# Patient Record
Sex: Male | Born: 1960 | Race: Black or African American | Hispanic: No | Marital: Married | Smoking: Never smoker
Health system: Southern US, Community
[De-identification: ages and names within clinical notes are randomized; demographics above are authoritative.]

---

## 2006-06-21 ENCOUNTER — Emergency Department (HOSPITAL_COMMUNITY): Admission: EM | Admit: 2006-06-21 | Discharge: 2006-06-21 | Payer: Self-pay | Admitting: Emergency Medicine

## 2016-09-11 ENCOUNTER — Emergency Department (HOSPITAL_COMMUNITY)
Admission: EM | Admit: 2016-09-11 | Discharge: 2016-09-11 | Disposition: A | Discharge status: Home / Self Care | Payer: Managed Care, Other (non HMO) | Attending: Emergency Medicine | Admitting: Emergency Medicine

## 2016-09-11 ENCOUNTER — Emergency Department (HOSPITAL_COMMUNITY): Payer: Managed Care, Other (non HMO)

## 2016-09-11 ENCOUNTER — Encounter (HOSPITAL_COMMUNITY): Payer: Self-pay

## 2016-09-11 DIAGNOSIS — R0981 Nasal congestion: Secondary | ICD-10-CM

## 2016-09-11 DIAGNOSIS — R059 Cough, unspecified: Secondary | ICD-10-CM

## 2016-09-11 DIAGNOSIS — J111 Influenza due to unidentified influenza virus with other respiratory manifestations: Secondary | ICD-10-CM | POA: Insufficient documentation

## 2016-09-11 DIAGNOSIS — R69 Illness, unspecified: Secondary | ICD-10-CM

## 2016-09-11 DIAGNOSIS — R05 Cough: Secondary | ICD-10-CM

## 2016-09-11 DIAGNOSIS — J069 Acute upper respiratory infection, unspecified: Secondary | ICD-10-CM

## 2016-09-11 DIAGNOSIS — Z79899 Other long term (current) drug therapy: Secondary | ICD-10-CM | POA: Diagnosis not present

## 2016-09-11 MED ORDER — FLUTICASONE PROPIONATE 50 MCG/ACT NA SUSP: 2.0000 | Freq: Every day | NASAL | 0 refills | Status: AC

## 2016-09-11 MED ORDER — BENZONATATE 100 MG PO CAPS: 100.0000 mg | ORAL_CAPSULE | Freq: Three times a day (TID) | ORAL | 0 refills | Status: AC

## 2016-09-11 MED ORDER — HYDROCODONE-HOMATROPINE 5-1.5 MG/5ML PO SYRP: 5.0000 mL | ORAL_SOLUTION | Freq: Four times a day (QID) | ORAL | 0 refills | Status: AC | PRN

## 2016-09-11 MED ORDER — GUAIFENESIN 100 MG/5ML PO LIQD: 100.0000 mg | ORAL | 0 refills | Status: AC | PRN

## 2016-09-11 NOTE — ED Provider Notes (Signed)
[facility]-EMERGENCY DEPT Provider Note    By signing my name below, I, Earmon Phoenix, attest that this documentation has been prepared under the direction and in the presence of Sharen Heck, PA-C. Electronically Signed: Earmon Phoenix, ED Scribe. 09/11/16. 1:01 PM    History   Chief Complaint Chief Complaint  Patient presents with  . Cough    The history is provided by the patient and medical records. No language interpreter was used.    Glen Cordova is an obese 56 y.o. male who presents to the Emergency Department complaining of a productive cough of clear mucous that began about three days ago. He reports associated sore throat, congestion and chest soreness secondary to coughing. His wife reports pt was sick with similar symptoms two weeks ago with fevers, myalgias and cough but symptoms resolved without medical treatment.  Patient started symptoms again 3 days ago.  He has taken NyQuil for his symptoms with minimal relief. Pt denies modifying factors. He denies current fever, chills, nausea, vomiting. He denies any chronic illnesses or daily medications.   Pt's wife notes pt never goes to the doctor and never takes medications, she remarks she is surprised he wanted to come to the ED today.    History reviewed. No pertinent past medical history.  There are no active problems to display for this patient.   No past surgical history on file.     Home Medications    Prior to Admission medications   Medication Sig Start Date End Date Taking? Authorizing Provider  benzonatate (TESSALON) 100 MG capsule Take 1 capsule (100 mg total) by mouth every 8 (eight) hours. FOR DISRUPTIVE DAY TIME COUGH, USE AS NEEDED 09/11/16   Liberty Handy, PA-C  fluticasone [facility]) 50 MCG/ACT nasal spray Place 2 sprays into both nostrils daily. 09/11/16   Liberty Handy, PA-C  guaiFENesin (ROBITUSSIN) 100 MG/5ML liquid Take 5-10 mLs (100-200 mg total) by mouth every 4 (four) hours as needed  for cough. 09/11/16   Liberty Handy, PA-C  HYDROcodone-homatropine (HYCODAN) 5-1.5 MG/5ML syrup Take 5 mLs by mouth every 6 (six) hours as needed for cough. FOR NIGHT TIME COUGH AND SORENESS 09/11/16   Liberty Handy, PA-C    Family History History reviewed. No pertinent family history.  Social History Social History  Substance Use Topics  . Smoking status: Not on file  . Smokeless tobacco: Not on file  . Alcohol use Not on file     Allergies   Aspirin   Review of Systems Review of Systems  Constitutional: Negative for chills and fever.  HENT: Positive for congestion. Negative for sore throat.   Eyes: Negative for visual disturbance.  Respiratory: Positive for cough. Negative for chest tightness and shortness of breath.   Cardiovascular: Negative for chest pain.  Gastrointestinal: Negative for abdominal pain, constipation, diarrhea, nausea and vomiting.  Genitourinary: Negative for decreased urine volume and difficulty urinating.  Musculoskeletal: Positive for myalgias. Negative for arthralgias and joint swelling.  Skin: Negative for rash.  Neurological: Negative for dizziness, light-headedness and headaches.     Physical Exam Updated Vital Signs BP 119/83 (BP Location: Left Arm)   Pulse 68   Temp 97.9 F (36.6 C) (Oral)   Resp 16   Ht 6' (1.829 m)   Wt 247 lb (112 kg)   SpO2 95%   BMI 33.50 kg/m   Physical Exam  Constitutional: He is oriented to person, place, and time. He appears well-developed and well-nourished. No distress.  NAD.  HENT:  Head: Normocephalic and atraumatic.  Right Ear: External ear normal.  Left Ear: External ear normal.  Nose: Mucosal edema present. Right sinus exhibits no maxillary sinus tenderness and no frontal sinus tenderness. Left sinus exhibits no maxillary sinus tenderness and no frontal sinus tenderness.  Mouth/Throat: Uvula is midline. Posterior oropharyngeal erythema present. No oropharyngeal exudate.  Moist mucous  membranes.  Bilateral mucosa edema. No nasal discharge noted. Oropharynx and tonsils with erythema. No edema, exudates or lesions. Uvula midline. No trismus.  Eyes: Conjunctivae and EOM are normal. Pupils are equal, round, and reactive to light. No scleral icterus.  Neck: Normal range of motion. Neck supple. No JVD present. No tracheal deviation present.  Cardiovascular: Normal rate, regular rhythm, normal heart sounds and intact distal pulses.   No murmur heard. Pulmonary/Chest: Effort normal. He has wheezes in the right upper field.  RR within normal limits. SpO2 within normal limits.  Normal breathing effort. Patient speaking in full sentences. No pursed lip breathing. Chest wall expansion symmetric.  No chest wall tenderness. Faint end expiratory wheezes in RUL anteriorly. No egophony.  Abdominal: Soft. He exhibits no distension. There is no tenderness.  Musculoskeletal: Normal range of motion. He exhibits no deformity.  Lymphadenopathy:    He has no cervical adenopathy.  Neurological: He is alert and oriented to person, place, and time.  Skin: Skin is warm and dry. Capillary refill takes less than 2 seconds.  Psychiatric: He has a normal mood and affect. His behavior is normal. Judgment and thought content normal.  Nursing note and vitals reviewed.    ED Treatments / Results  DIAGNOSTIC STUDIES: Oxygen Saturation is 95% on RA, adequate by my interpretation.   COORDINATION OF CARE: 12:10 PM- Will order CXR. Pt verbalizes understanding and agrees to plan.  Medications - No data to display  Labs (all labs ordered are listed, but only abnormal results are displayed) Labs Reviewed - No data to display  EKG  EKG Interpretation None       Radiology Dg Chest 2 View  Result Date: 09/11/2016 CLINICAL DATA:  Productive cough, congestion for 3 days EXAM: CHEST  2 VIEW COMPARISON:  None. FINDINGS: Heart and mediastinal contours are within normal limits. No focal opacities or  effusions. No acute bony abnormality. IMPRESSION: No active cardiopulmonary disease. Electronically Signed   By: Charlett NoseKevin  Dover M.D.   On: 09/11/2016 12:30    Procedures Procedures (including critical care time)  Medications Ordered in ED Medications - No data to display   Initial Impression / Assessment and Plan / ED Course  I have reviewed the triage vital signs and the nursing notes.  Pertinent labs & imaging results that were available during my care of the patient were reviewed by me and considered in my medical decision making (see chart for details).    56 year old male with no pertinent past medical history presents to the emergency department with upper respiratory infection symptoms 3 days, likely due to a viral upper respiratory infection. On my exam patient is nontoxic appearing, speaking in full sentences. No fever, no tachypnea, no tachycardia, normal oxygen saturations. There is faint end expiratory wheezing at RUL anteriorly, otherwise lungs are clear to auscultation bilaterally without wheezing, rales, egophony. Given "double sickening" with faint wheezing CXR wasobtained, which was negative for PNA. I doubt bacterial bronchitis, pneumonia, PE, ACS. I think that symptoms are likely due to acute viral URI that can be treated conservatively at this point. Given reassuring physical exam and vital signs within  normal limits patient will be discharged with symptomatic treatment including Flonase, Tessalon, Robitussin, Hycodan. Strict ED return precautions given. Patient is aware that a viral upper respiratory tract infection may precede the onset of acute bronchitis, pneumonia. Patient is aware of red flag symptoms to monitor for that would warrant return to the emergency department for further reevaluation. Pt ambulated with pulse ox within normal limits prior to discharge.    I personally performed the services described in this documentation, which was scribed in my presence. The  recorded information has been reviewed and is accurate.   Final Clinical Impressions(s) / ED Diagnoses   Final diagnoses:  Cough  Acute upper respiratory infection  Influenza-like illness  Nasal congestion    New Prescriptions Discharge Medication List as of 09/11/2016  1:06 PM    START taking these medications   Details  benzonatate (TESSALON) 100 MG capsule Take 1 capsule (100 mg total) by mouth every 8 (eight) hours. FOR DISRUPTIVE DAY TIME COUGH, USE AS NEEDED, Starting Sun 09/11/2016, Print    fluticasone (FLONASE) 50 MCG/ACT nasal spray Place 2 sprays into both nostrils daily., Starting Sun 09/11/2016, Print    guaiFENesin (ROBITUSSIN) 100 MG/5ML liquid Take 5-10 mLs (100-200 mg total) by mouth every 4 (four) hours as needed for cough., Starting Sun 09/11/2016, Print    HYDROcodone-homatropine (HYCODAN) 5-1.5 MG/5ML syrup Take 5 mLs by mouth every 6 (six) hours as needed for cough. FOR NIGHT TIME COUGH AND SORENESS, Starting Sun 09/11/2016, Print         Liberty Handy, PA-C 09/11/16 1329    Mancel Bale, MD 09/11/16 1701

## 2016-09-11 NOTE — ED Triage Notes (Signed)
Pt states that about 3 days ago, he began experiencing a productive cough. He states that the mucous is clear when he coughs it up. Denies fever, vomiting. States that his chest is sore when he coughs. A&Ox4.

## 2016-09-11 NOTE — Discharge Instructions (Signed)
Your chest x-ray was negative today. We will treat you for a viral upper respiratory infection, possibly the flu.  You have been prescribed Flonase for nasal congestion, Tessalon for disruptive daytime cough, Hycodan syrup for nighttime cough and soreness and Mucinex for chest congestion and phlegm. Please take these medications as prescribed. Your symptoms should start to improve in the next 7-10 days. Make sure to drink plenty of liquids and stay hydrated, rest. Return to the emergency department if your symptoms worsen, if you develop fevers or shortness of breath after a total of 10 days.

## 2020-12-25 ENCOUNTER — Emergency Department (HOSPITAL_COMMUNITY)
Admission: EM | Admit: 2020-12-25 | Discharge: 2020-12-25 | Disposition: A | Discharge status: Home / Self Care | Payer: 59 | Attending: Emergency Medicine | Admitting: Emergency Medicine

## 2020-12-25 ENCOUNTER — Emergency Department (HOSPITAL_COMMUNITY): Payer: 59

## 2020-12-25 ENCOUNTER — Other Ambulatory Visit: Payer: Self-pay

## 2020-12-25 ENCOUNTER — Encounter (HOSPITAL_COMMUNITY): Payer: Self-pay

## 2020-12-25 DIAGNOSIS — Z5321 Procedure and treatment not carried out due to patient leaving prior to being seen by health care provider: Secondary | ICD-10-CM | POA: Insufficient documentation

## 2020-12-25 DIAGNOSIS — M25572 Pain in left ankle and joints of left foot: Secondary | ICD-10-CM | POA: Diagnosis not present

## 2020-12-25 NOTE — ED Triage Notes (Signed)
Patient states he was playing basketball and injured his left heel 6 days ago.

## 2020-12-25 NOTE — ED Provider Notes (Signed)
Emergency Medicine Provider Triage Evaluation Note  Glen Cordova , a 60 y.o. male  was evaluated in triage.  Pt complains of left ankle and heel pain after playing basketball a few days ago.  Reports pain at the Achilles tendon, swelling.  Pain increases with ambulation and plantar flexion.  Review of Systems  Positive: Ankle pain and swelling Negative: Numbness weakness tingling  Physical Exam  BP (!) 167/96 (BP Location: Left Arm)   Pulse 61   Temp 97.9 F (36.6 C) (Oral)   Resp 16   SpO2 96%  Gen:   Awake, no distress   Resp:  Normal effort  MSK:   Tenderness at the Achilles tendon no palpable defect.  Moderate swelling.  No tenderness of the medial or lateral malleolus.  Pedal pulses and sensation intact.    Medical Decision Making  Medically screening exam initiated at 10:22 AM.  Appropriate orders placed.  Joshuajames Moehring was informed that the remainder of the evaluation will be completed by another provider, this initial triage assessment does not replace that evaluation, and the importance of remaining in the ED until their evaluation is complete.  Possible ankle sprain versus Achilles tendon injury.  Will obtain x-ray.    Note: Portions of this report may have been transcribed using voice recognition software. Every effort was made to ensure accuracy; however, inadvertent computerized transcription errors may still be present.    Elizabeth Palau 12/25/20 1024    Wynetta Fines, MD 12/26/20 [phone]

## 2022-01-02 IMAGING — CR DG ANKLE COMPLETE 3+V*L*
3 series · 3 of 3 positions shown · non-contrast
Comparison: None.

CLINICAL DATA: Injury to the LEFT ankle several days ago while
playing basketball, persistent pain and swelling. Initial encounter.

EXAM:
LEFT ANKLE COMPLETE - 3+ VIEW

[x ankle ap left]
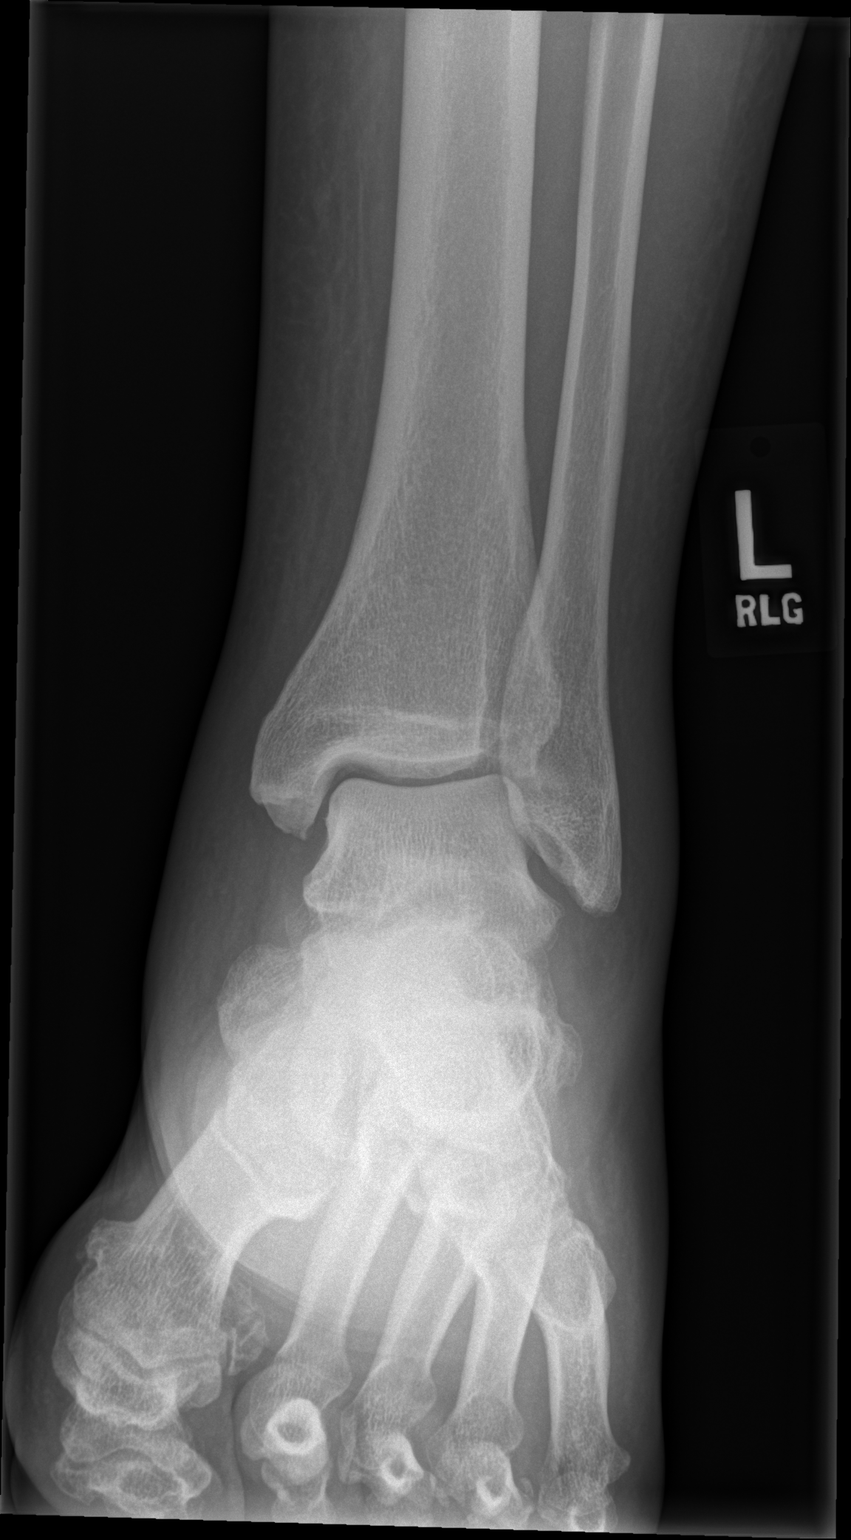

[x ankle obl left]
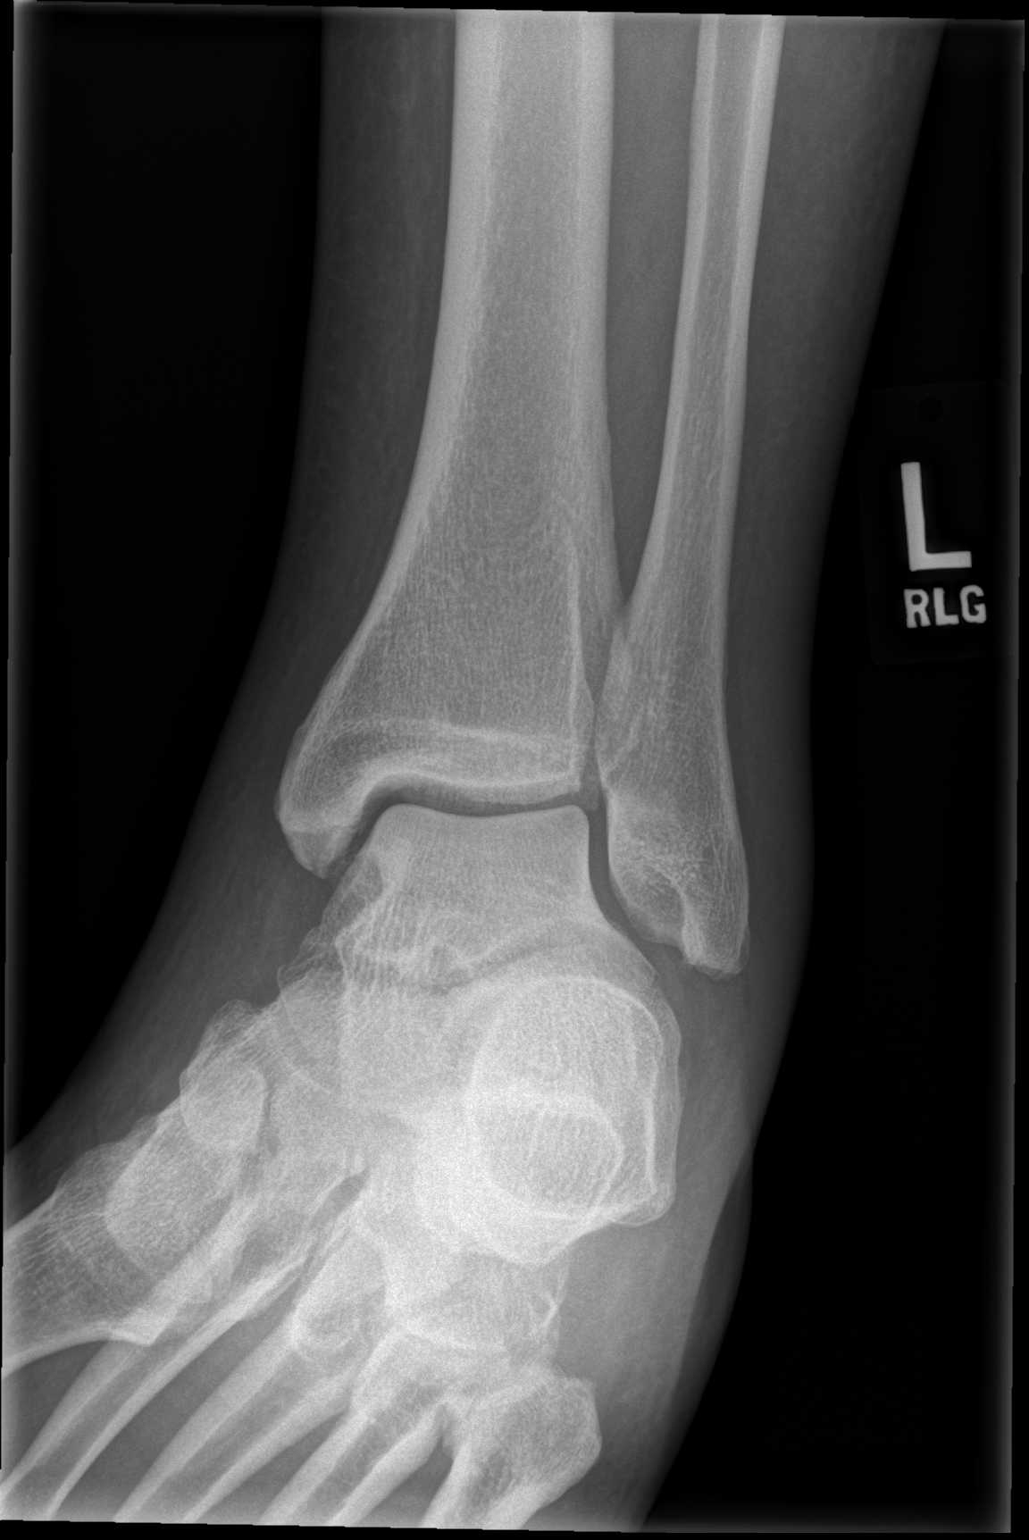

[x ankle lat left]
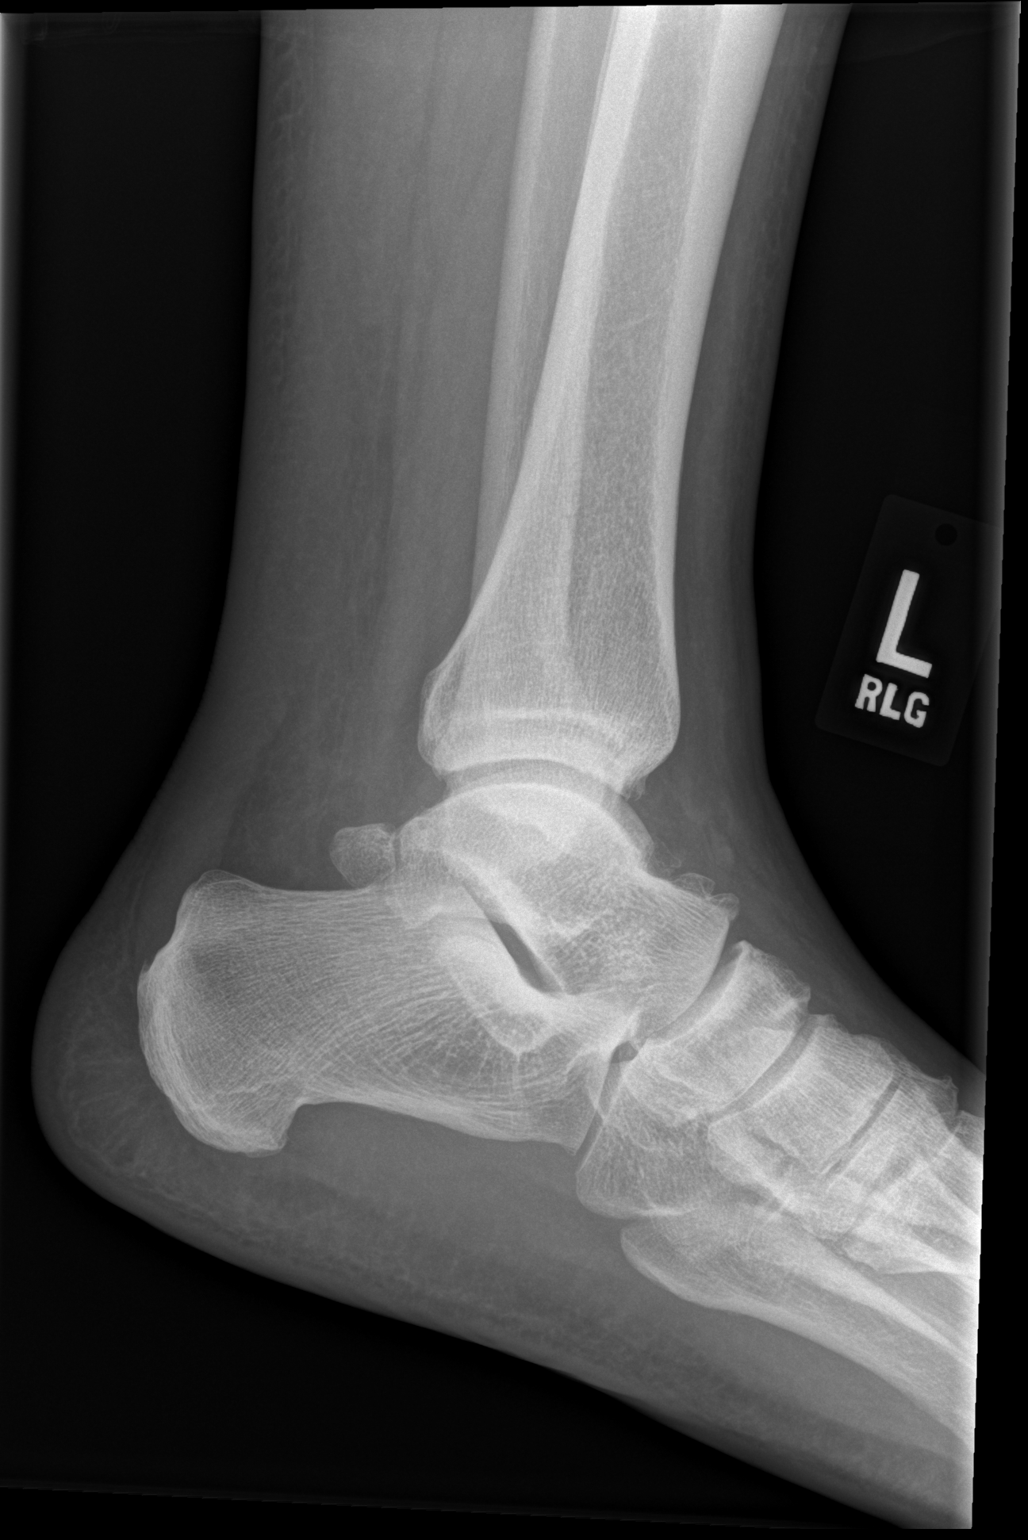

[3 of 3 positions shown; findings below may reference images not displayed]

FINDINGS: Diffuse soft tissue swelling. No evidence of acute or subacute
fracture or dislocation. Tibiotalar joint anatomically aligned with
a well-preserved joint space. Os trigonum (accessory ossicle)
posterior to the talus. Small spurs involving the talus anteriorly.
IMPRESSION: No acute or subacute osseous abnormality.
